# Patient Record
Sex: Male | Born: 2000 | Race: Black or African American | Hispanic: No | Marital: Single | Smoking: Current some day smoker
Health system: Southern US, Community
[De-identification: ages and names within clinical notes are randomized; demographics above are authoritative.]

## PROBLEM LIST (undated history)

## (undated) DIAGNOSIS — J45909 Unspecified asthma, uncomplicated: Secondary | ICD-10-CM

## (undated) DIAGNOSIS — F909 Attention-deficit hyperactivity disorder, unspecified type: Secondary | ICD-10-CM

---
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
## 2018-02-27 IMAGING — CR DG CHEST 2V
2 series · 2 of 2 positions shown · non-contrast
Comparison: 10/08/2007

CLINICAL DATA: Chest pain.  Cough.

EXAM:
CHEST  2 VIEW

[chest pa]
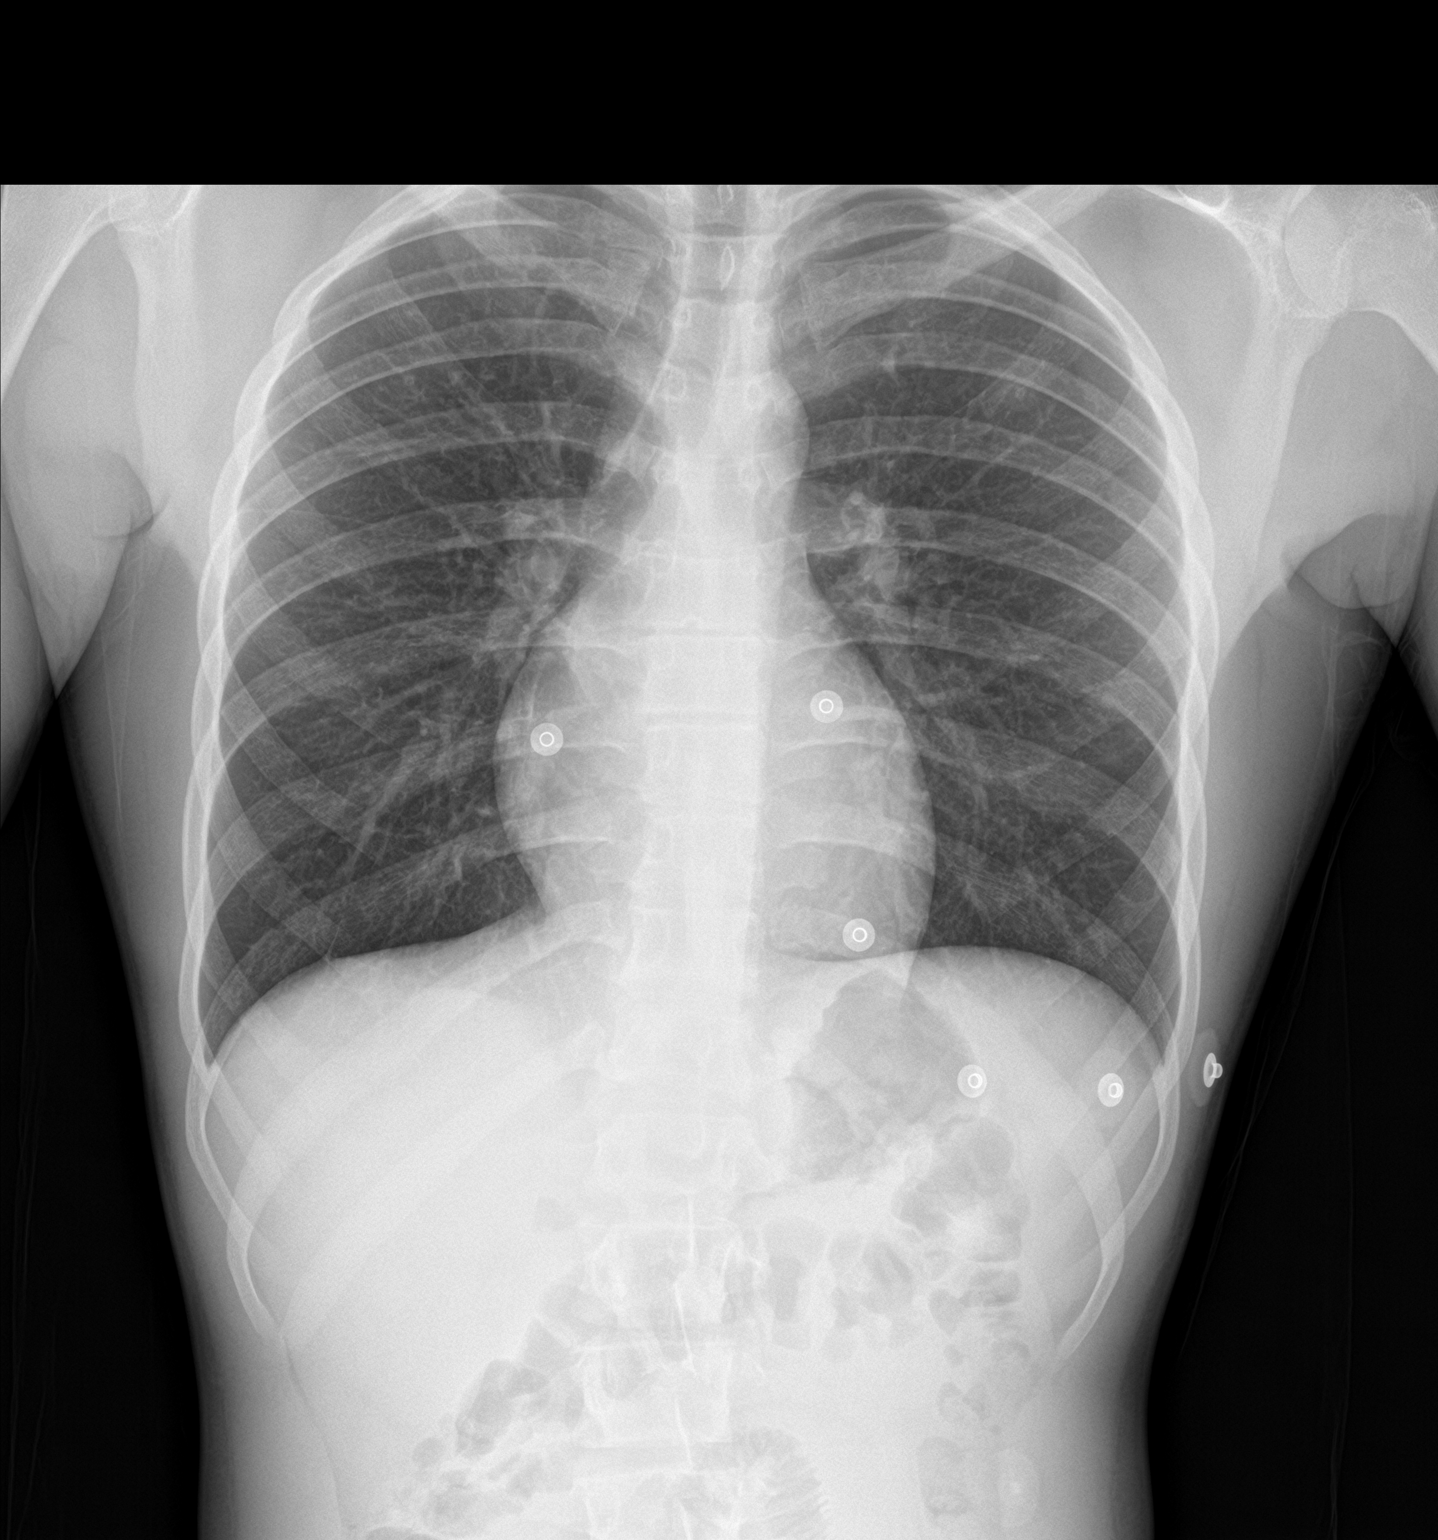

[chest lat]
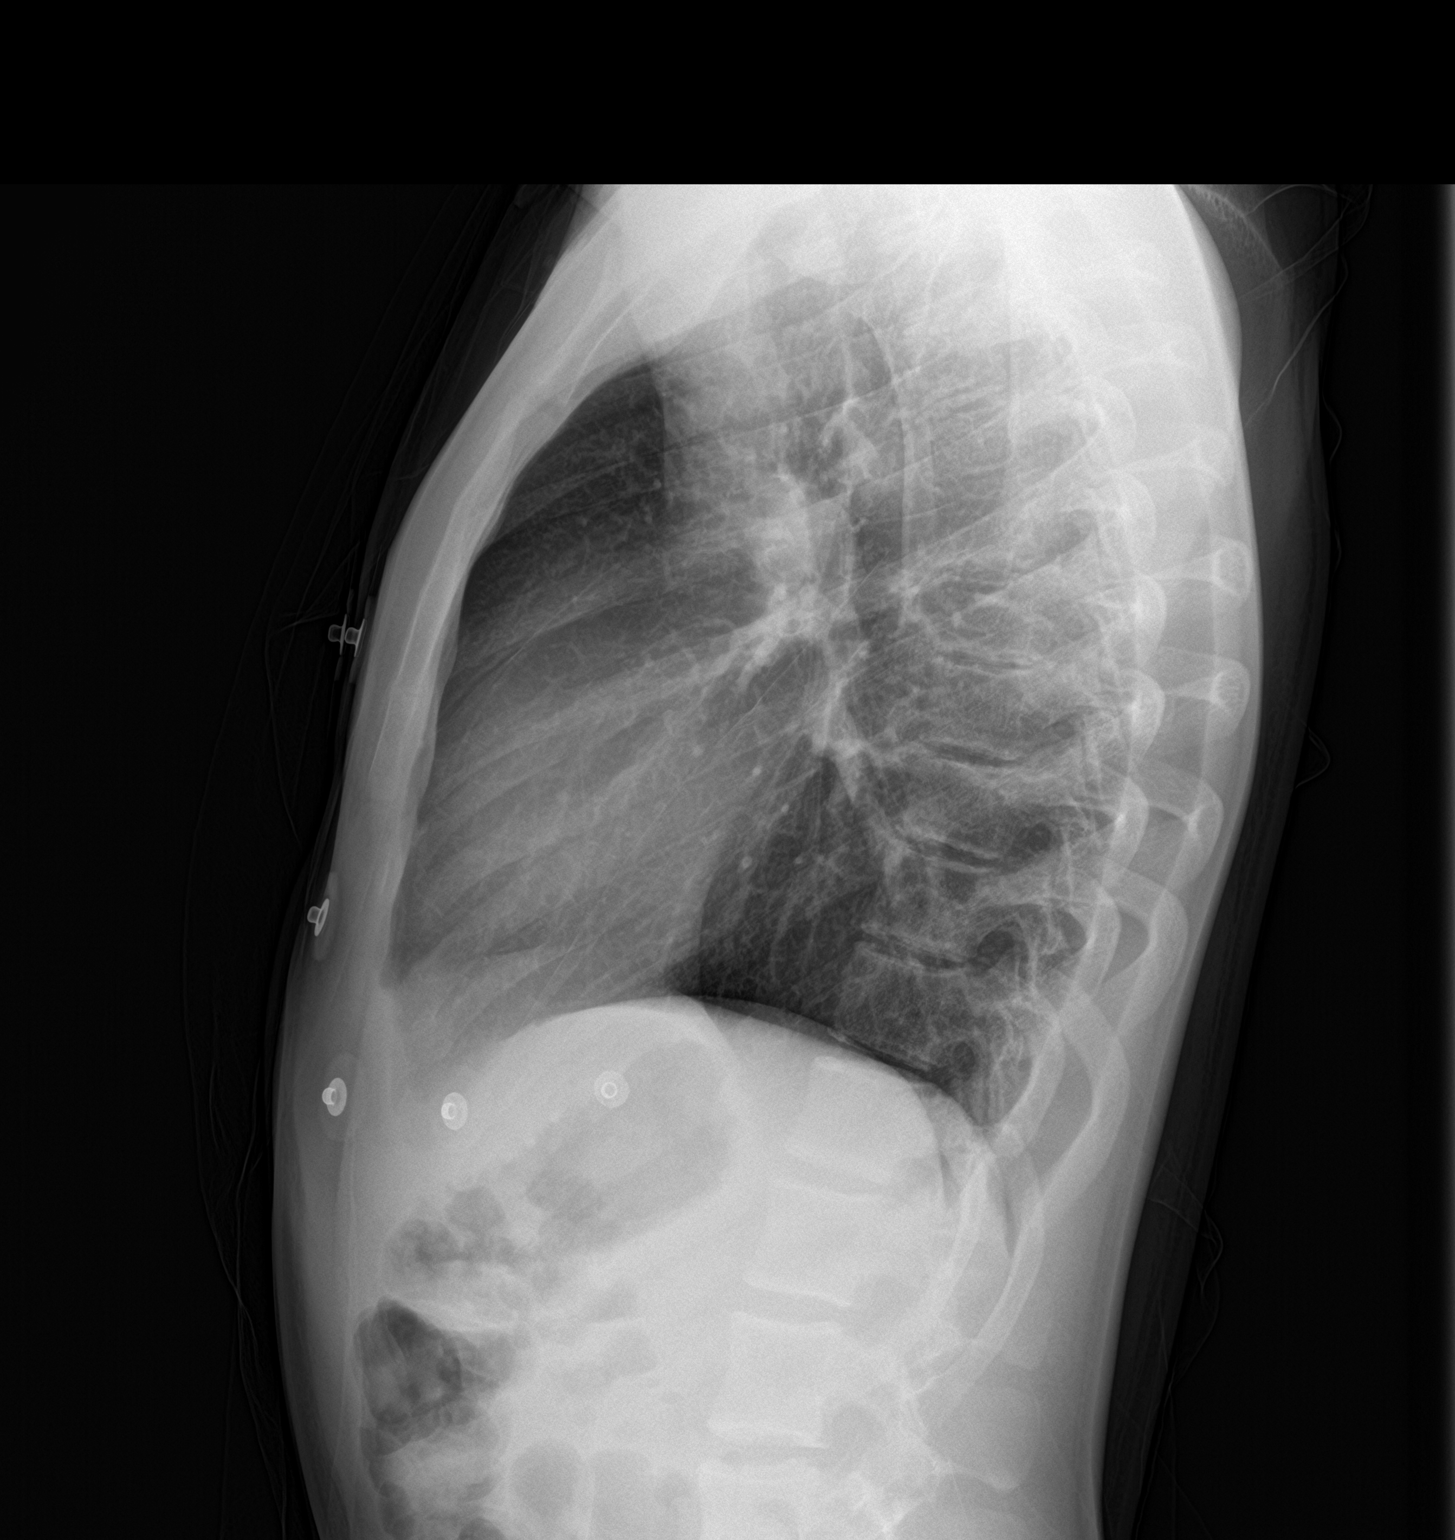

[2 of 2 positions shown; findings below may reference images not displayed]

FINDINGS: The heart size and mediastinal contours are within normal limits.
Both lungs are clear. The visualized skeletal structures are
unremarkable.
IMPRESSION: No active cardiopulmonary disease.

## 2018-08-18 ENCOUNTER — Encounter (HOSPITAL_COMMUNITY): Payer: Self-pay

## 2018-08-18 ENCOUNTER — Emergency Department (HOSPITAL_COMMUNITY)
Admission: EM | Admit: 2018-08-18 | Discharge: 2018-08-18 | Disposition: A | Discharge status: Home / Self Care | Payer: Medicaid Other | Attending: Emergency Medicine | Admitting: Emergency Medicine

## 2018-08-18 DIAGNOSIS — R05 Cough: Secondary | ICD-10-CM | POA: Diagnosis not present

## 2018-08-18 DIAGNOSIS — Z5321 Procedure and treatment not carried out due to patient leaving prior to being seen by health care provider: Secondary | ICD-10-CM | POA: Diagnosis not present

## 2018-08-18 DIAGNOSIS — R509 Fever, unspecified: Secondary | ICD-10-CM | POA: Insufficient documentation

## 2018-08-18 NOTE — ED Notes (Signed)
Did not answer for room X3 

## 2018-08-18 NOTE — ED Triage Notes (Signed)
Pt states that he has been having cough, stuffy, fever, vomiting, headache, chills, and body aches all day

## 2018-10-24 ENCOUNTER — Other Ambulatory Visit: Payer: Self-pay

## 2018-10-24 ENCOUNTER — Encounter (HOSPITAL_COMMUNITY): Payer: Self-pay

## 2018-10-24 ENCOUNTER — Emergency Department (HOSPITAL_COMMUNITY)
Admission: EM | Admit: 2018-10-24 | Discharge: 2018-10-24 | Disposition: A | Discharge status: Home / Self Care | Payer: Medicaid Other | Attending: Emergency Medicine | Admitting: Emergency Medicine

## 2018-10-24 DIAGNOSIS — R1030 Lower abdominal pain, unspecified: Secondary | ICD-10-CM | POA: Insufficient documentation

## 2018-10-24 DIAGNOSIS — J45909 Unspecified asthma, uncomplicated: Secondary | ICD-10-CM | POA: Diagnosis not present

## 2018-10-24 DIAGNOSIS — F909 Attention-deficit hyperactivity disorder, unspecified type: Secondary | ICD-10-CM | POA: Diagnosis not present

## 2018-10-24 DIAGNOSIS — F17200 Nicotine dependence, unspecified, uncomplicated: Secondary | ICD-10-CM | POA: Insufficient documentation

## 2018-10-24 LAB — COMPREHENSIVE METABOLIC PANEL WITH GFR
ALT: 18 U/L (ref 0–44)
AST: 27 U/L (ref 15–41)
Albumin: 3.4 g/dL — ABNORMAL LOW (ref 3.5–5.0)
Alkaline Phosphatase: 43 U/L (ref 38–126)
Anion gap: 6 (ref 5–15)
BUN: 8 mg/dL (ref 6–20)
CO2: 28 mmol/L (ref 22–32)
Calcium: 8.6 mg/dL — ABNORMAL LOW (ref 8.9–10.3)
Chloride: 108 mmol/L (ref 98–111)
Creatinine, Ser: 0.88 mg/dL (ref 0.61–1.24)
GFR calc Af Amer: >60 mL/min
GFR calc non Af Amer: >60 mL/min
Glucose, Bld: 108 mg/dL — ABNORMAL HIGH (ref 70–99)
Potassium: 3.8 mmol/L (ref 3.5–5.1)
Sodium: 142 mmol/L (ref 135–145)
Total Bilirubin: 0.4 mg/dL (ref 0.3–1.2)
Total Protein: 5.8 g/dL — ABNORMAL LOW (ref 6.5–8.1)

## 2018-10-24 LAB — URINALYSIS, ROUTINE W REFLEX MICROSCOPIC
BILIRUBIN URINE: NEGATIVE
GLUCOSE, UA: NEGATIVE mg/dL
Hgb urine dipstick: NEGATIVE
KETONES UR: NEGATIVE mg/dL
LEUKOCYTE UA: NEGATIVE
Nitrite: NEGATIVE
PROTEIN: NEGATIVE mg/dL
Specific Gravity, Urine: 1.016 (ref 1.005–1.030)
pH: 7 (ref 5.0–8.0)

## 2018-10-24 LAB — CBC
HCT: 46.3 % (ref 39.0–52.0)
HEMOGLOBIN: 14.4 g/dL (ref 13.0–17.0)
MCH: 28.9 pg (ref 26.0–34.0)
MCHC: 31.1 g/dL (ref 30.0–36.0)
MCV: 93 fL (ref 80.0–100.0)
PLATELETS: 207 10*3/uL (ref 150–400)
RBC: 4.98 MIL/uL (ref 4.22–5.81)
RDW: 13.2 % (ref 11.5–15.5)
WBC: 4.2 10*3/uL (ref 4.0–10.5)
nRBC: 0 % (ref 0.0–0.2)

## 2018-10-24 LAB — LIPASE, BLOOD: Lipase: 24 U/L (ref 11–51)

## 2018-10-24 MED ORDER — ACETAMINOPHEN 325 MG PO TABS
650.0000 mg | ORAL_TABLET | Freq: Once | ORAL | Status: AC
  Administered 2018-10-24: 650 mg via ORAL
  Filled 2018-10-24: qty 2

## 2018-10-24 MED ORDER — SODIUM CHLORIDE 0.9% FLUSH
3.0000 mL | Freq: Once | INTRAVENOUS | Status: AC
  Administered 2018-10-24: 3 mL via INTRAVENOUS

## 2018-10-24 NOTE — Discharge Instructions (Signed)
Your laboratory results were within normal limits today. I tried obtaining a CT to r/o appendicitis. If you experience any fever, stop eating, or pain worsen please return to the ED. You may alternate ibuprofen and tylenol for your pain.

## 2018-10-24 NOTE — ED Provider Notes (Signed)
[facility]-EMERGENCY DEPT Provider Note   CSN: [id number] Arrival date & time: 10/24/18  1855    History   Chief Complaint Chief Complaint  Patient presents with  . Abdominal Pain    HPI Mario Sanford is a 18 y.o. male.     18 y/o male with a PMH of ADHD, Asthma presents to the ED with a chief complaint of lower abdominal pain x 3 hours. Patient reports a pressure pain around the lower part of his suprapubic region with radiation to both sides. He reports not trying any medication for relieve in symptoms. He reports his last bowel movement was 3 hours ago which was normal in nature. Reports his last meal was this evening before the pain and consisted of egg rolls. He denies any fever, nausea, vomiting, diarrhea,previous surgical history to his abdomen.      Past Medical History:  Diagnosis Date  . ADHD   . Asthma     There are no active problems to display for this patient.   History reviewed. No pertinent surgical history.      Home Medications    Prior to Admission medications   Medication Sig Start Date End Date Taking? Authorizing Provider  albuterol (PROVENTIL HFA;VENTOLIN HFA) 108 (90 BASE) MCG/ACT inhaler Inhale 2 puffs into the lungs 2 (two) times daily as needed for wheezing or shortness of breath.    [provider]  beclomethasone (QVAR) 80 MCG/ACT inhaler Inhale 1 puff into the lungs 2 (two) times daily as needed (for wheezing).    [provider]  dexmethylphenidate (FOCALIN XR) 15 MG 24 hr capsule Take 15 mg by mouth daily. Only on school days    [provider]  GuanFACINE HCl (INTUNIV) 3 MG TB24 Take 3 mg by mouth every evening. On school days only    [provider]    Family History No family history on file.  Social History Social History   Tobacco Use  . Smoking status: Current Some Day Smoker  . Smokeless tobacco: Never Used  Substance Use Topics  . Alcohol use: Yes  . Drug  use: Not on file     Allergies   Penicillins and Iodine   Review of Systems Review of Systems  Constitutional: Negative for chills and fever.  HENT: Negative for ear pain and sore throat.   Eyes: Negative for pain and visual disturbance.  Respiratory: Negative for cough and shortness of breath.   Cardiovascular: Negative for chest pain and palpitations.  Gastrointestinal: Positive for abdominal pain. Negative for blood in stool, constipation, diarrhea, nausea and vomiting.  Genitourinary: Negative for dysuria and hematuria.  Musculoskeletal: Negative for arthralgias and back pain.  Skin: Negative for color change and rash.  Neurological: Negative for seizures and syncope.  All other systems reviewed and are negative.    Physical Exam Updated Vital Signs BP 124/80   Pulse 66   Temp 99 F (37.2 C) (Oral)   Resp 18   Ht  (1.626 m)   Wt 56.7 kg   SpO2 100%   BMI 21.46 kg/m   Physical Exam Vitals signs and nursing note reviewed.  Constitutional:      Appearance: He is well-developed.  HENT:     Head: Normocephalic and atraumatic.  Eyes:     General: No scleral icterus.    Pupils: Pupils are equal, round, and reactive to light.  Neck:     Musculoskeletal: Normal range of motion.  Cardiovascular:  Rate and Rhythm: Normal rate.     Heart sounds: Normal heart sounds.  Pulmonary:     Effort: Pulmonary effort is normal.     Breath sounds: Normal breath sounds. No wheezing.  Chest:     Chest wall: No tenderness.  Abdominal:     General: Bowel sounds are normal. There is no distension.     Palpations: Abdomen is soft.     Tenderness: There is abdominal tenderness in the right lower quadrant, suprapubic area and left lower quadrant. There is guarding. There is no right CVA tenderness or left CVA tenderness. Positive signs include McBurney's sign. Negative signs include Murphy's sign and Rovsing's sign.  Musculoskeletal:        General: No tenderness or  deformity.  Skin:    General: Skin is warm and dry.  Neurological:     Mental Status: He is alert and oriented to person, place, and time.      ED Treatments / Results  Labs (all labs ordered are listed, but only abnormal results are displayed) Labs Reviewed  COMPREHENSIVE METABOLIC PANEL - Abnormal; Notable for the following components:      Result Value   Glucose, Bld 108 (*)    Calcium 8.6 (*)    Total Protein 5.8 (*)    Albumin 3.4 (*)    All other components within normal limits  URINALYSIS, ROUTINE W REFLEX MICROSCOPIC - Abnormal; Notable for the following components:   Color, Urine STRAW (*)    All other components within normal limits  LIPASE, BLOOD  CBC    EKG None  Radiology No results found.  Procedures Procedures (including critical care time)  Medications Ordered in ED Medications  sodium chloride flush (NS) 0.9 % injection 3 mL (has no administration in time range)  acetaminophen (TYLENOL) tablet 650 mg (650 mg Oral Given 10/24/18 2000)     Initial Impression / Assessment and Plan / ED Course  I have reviewed the triage vital signs and the nursing notes.  Pertinent labs & imaging results that were available during my care of the patient were reviewed by me and considered in my medical decision making (see chart for details).       Patient with no previous medical history presents to the ED with lower abdominal pain x3 hours.  He reports not taking any medication for his pain.  Reports the pain is mainly on the periumbilical region radiating to the right and left side.  He reports the pain started after having some egg rolls for dinner which was 3 hours ago.  Denies any fevers, previous surgical history on his abdomen.  Low suspicion for obstruction as patient had bowel movement prior to arrival, denies any diarrhea or blood in his stool.  Pain is along the lower area, will obtain blood work along with provide patient with Tylenol for his pain.  CMP  showed no electrolyte abnormality, he denies any episodes of vomiting, low suspicion for any cholecystitis, LFTs are within normal limits reports pain is more so in the lower area.  Lipase is within normal limits.  CBC showed no leukocytosis, hemoglobin is within normal limits.  9:07 PM patient was reassessed by me, explained about obtaining imaging at this time, he reports his pain is feeling better.  I have discussed the risks and benefit of leaving at this time.  Patient reports he needs to go home at this time and will return if he worsens, experiences fever.  I explained to him that  I cannot rule out appendicitis although he is afebrile, has not white blood cell count at this time.  Patient is advised to return if he experiences any fever, anorexia, worsening symptoms.  Patient and family member at the bedside understand and agree with risks and benefits.  Stable for discharge with stable vital signs discharged from the ED.  Final Clinical Impressions(s) / ED Diagnoses   Final diagnoses:  Lower abdominal pain    ED Discharge Orders    None       Freddy Jaksch 10/24/18 2108    Charlynne Pander, MD 10/24/18 [phone]

## 2018-10-24 NOTE — ED Triage Notes (Addendum)
Pt states LUQ abdominal pain x several hours. Pt denies N/V/D.

## 2019-01-10 ENCOUNTER — Encounter (HOSPITAL_COMMUNITY): Payer: Self-pay

## 2019-01-10 ENCOUNTER — Emergency Department (HOSPITAL_COMMUNITY)
Admission: EM | Admit: 2019-01-10 | Discharge: 2019-01-10 | Discharge status: Against Medical Advice | Payer: Medicaid Other | Attending: Emergency Medicine | Admitting: Emergency Medicine

## 2019-01-10 ENCOUNTER — Other Ambulatory Visit: Payer: Self-pay

## 2019-01-10 DIAGNOSIS — R300 Dysuria: Secondary | ICD-10-CM | POA: Diagnosis present

## 2019-01-10 DIAGNOSIS — Z5321 Procedure and treatment not carried out due to patient leaving prior to being seen by health care provider: Secondary | ICD-10-CM | POA: Insufficient documentation

## 2019-01-10 NOTE — ED Notes (Signed)
PT just walked out of room. LEFT MIA

## 2019-01-10 NOTE — ED Triage Notes (Signed)
Pt reports burning with urination and urinary frequency for the past 4 days. States he would like to be tested for STDs also.

## 2019-02-12 ENCOUNTER — Encounter (HOSPITAL_COMMUNITY): Payer: Self-pay

## 2019-02-12 ENCOUNTER — Ambulatory Visit (HOSPITAL_COMMUNITY)
Admission: EM | Admit: 2019-02-12 | Discharge: 2019-02-12 | Disposition: A | Discharge status: Home / Self Care | Payer: Medicaid Other | Attending: Internal Medicine | Admitting: Internal Medicine

## 2019-02-12 DIAGNOSIS — R300 Dysuria: Secondary | ICD-10-CM | POA: Insufficient documentation

## 2019-02-12 LAB — POCT URINALYSIS DIP (DEVICE)
Bilirubin Urine: NEGATIVE
Glucose, UA: NEGATIVE mg/dL
Hgb urine dipstick: NEGATIVE
Ketones, ur: NEGATIVE mg/dL
Nitrite: NEGATIVE
Protein, ur: NEGATIVE mg/dL
Specific Gravity, Urine: >=1.03 (ref 1.005–1.030)
Urobilinogen, UA: 0.2 mg/dL (ref 0.0–1.0)
pH: 6 (ref 5.0–8.0)

## 2019-02-12 MED ORDER — GUANFACINE HCL ER 3 MG PO TB24: 3.0000 mg | ORAL_TABLET | Freq: Every evening | ORAL | 0 refills | Status: AC

## 2019-02-12 MED ORDER — ALBUTEROL SULFATE HFA 108 (90 BASE) MCG/ACT IN AERS: 2.0000 | INHALATION_SPRAY | Freq: Two times a day (BID) | RESPIRATORY_TRACT | 0 refills | Status: AC | PRN

## 2019-02-12 MED ORDER — BECLOMETHASONE DIPROPIONATE 80 MCG/ACT IN AERS: 1.0000 | INHALATION_SPRAY | Freq: Two times a day (BID) | RESPIRATORY_TRACT | 0 refills | Status: AC | PRN

## 2019-02-12 MED ORDER — DEXMETHYLPHENIDATE HCL ER 15 MG PO CP24: 15.0000 mg | ORAL_CAPSULE | Freq: Every day | ORAL | 0 refills | Status: AC

## 2019-02-12 NOTE — ED Provider Notes (Signed)
[facility]    CSN: [id number] Arrival date & time: 02/12/19  1351     History   Chief Complaint Chief Complaint  Patient presents with  . Exposure to STD  . Urinary Frequency    HPI Mario Sanford is a 18 y.o. male a history of ADHD-controlled on Focalin, asthma-controlled with albuterol comes to urgent care with complaints of  penile discomfort when he is urinating.  Symptoms have been ongoing for a month.  He has not tried any over-the-counter medications.  He is sexually active and has engaged in unprotected sexual intercourse.  No testicular pain or swelling.  No penile ulceration.  No rash.  No fever or chills.  No nausea vomiting.  Patient is also requesting refill on his routine medications.  HPI  Past Medical History:  Diagnosis Date  . ADHD   . Asthma     There are no active problems to display for this patient.   History reviewed. No pertinent surgical history.     Home Medications    Prior to Admission medications   Medication Sig Start Date End Date Taking? Authorizing Provider  albuterol (VENTOLIN HFA) 108 (90 Base) MCG/ACT inhaler Inhale 2 puffs into the lungs 2 (two) times daily as needed for wheezing or shortness of breath. 02/12/19   Merrilee JanskyLamptey, Keigan Girten O, MD  beclomethasone (QVAR) 80 MCG/ACT inhaler Inhale 1 puff into the lungs 2 (two) times daily as needed (for wheezing). 02/12/19   Merrilee JanskyLamptey, Dajha Urquilla O, MD  dexmethylphenidate (FOCALIN XR) 15 MG 24 hr capsule Take 1 capsule (15 mg total) by mouth daily. Only on school days 02/12/19   Gaylene Moylan, Britta MccreedyPhilip O, MD  GuanFACINE HCl (INTUNIV) 3 MG TB24 Take 1 tablet (3 mg total) by mouth every evening. On school days only 02/12/19   Kaymarie Wynn, Britta MccreedyPhilip O, MD    Family History History reviewed. No pertinent family history.  Social History Social History   Tobacco Use  . Smoking status: Current Some Day Smoker  . Smokeless tobacco: Never Used  Substance Use Topics  . Alcohol use: Yes  . Drug use: Not on  file     Allergies   Penicillins and Iodine   Review of Systems Review of Systems  Constitutional: Negative.   HENT: Negative for mouth sores.   Eyes: Negative for discharge and itching.  Respiratory: Negative.   Cardiovascular: Negative.   Gastrointestinal: Negative for constipation, nausea and vomiting.  Genitourinary: Positive for dysuria. Negative for genital sores, penile pain, penile swelling and testicular pain.  Musculoskeletal: Negative for arthralgias, gait problem, joint swelling and myalgias.  Skin: Negative for rash and wound.  Neurological: Negative.      Physical Exam Triage Vital Signs ED Triage Vitals  Enc Vitals Group     BP 02/12/19 1429 109/70     Pulse Rate 02/12/19 1429 93     Resp 02/12/19 1429 18     Temp 02/12/19 1429 98.2 F (36.8 C)     Temp Source 02/12/19 1429 Oral     SpO2 02/12/19 1429 99 %     Weight --      Height --      Head Circumference --      Peak Flow --      Pain Score 02/12/19 1430 7     Pain Loc --      Pain Edu? --      Excl. in GC? --    No data found.  Updated Vital Signs BP 109/70 (  BP Location: Left Arm)   Pulse 93   Temp 98.2 F (36.8 C) (Oral)   Resp 18   SpO2 99%   Visual Acuity Right Eye Distance:   Left Eye Distance:   Bilateral Distance:    Right Eye Near:   Left Eye Near:    Bilateral Near:     Physical Exam Constitutional:      Appearance: Normal appearance. He is not ill-appearing or toxic-appearing.  HENT:     Mouth/Throat:     Mouth: Mucous membranes are moist.     Pharynx: No oropharyngeal exudate or posterior oropharyngeal erythema.  Cardiovascular:     Rate and Rhythm: Normal rate and regular rhythm.     Pulses: Normal pulses.     Heart sounds: Normal heart sounds. No murmur. No gallop.   Pulmonary:     Effort: Pulmonary effort is normal. No respiratory distress.     Breath sounds: Normal breath sounds. No wheezing or rales.  Abdominal:     General: Bowel sounds are normal.      Palpations: Abdomen is soft.  Musculoskeletal: Normal range of motion.  Skin:    General: Skin is warm.     Capillary Refill: Capillary refill takes less than 2 seconds.  Neurological:     General: No focal deficit present.     Mental Status: He is alert and oriented to person, place, and time.      UC Treatments / Results  Labs (all labs ordered are listed, but only abnormal results are displayed) Labs Reviewed  POCT URINALYSIS DIP (DEVICE) - Abnormal; Notable for the following components:      Result Value   Leukocytes,Ua TRACE (*)    All other components within normal limits  URINE CYTOLOGY ANCILLARY ONLY - Abnormal; Notable for the following components:   Chlamydia **POSITIVE** (*)    All other components within normal limits    EKG   Radiology No results found.  Procedures Procedures (including critical care time)  Medications Ordered in UC Medications - No data to display  Initial Impression / Assessment and Plan / UC Course  I have reviewed the triage vital signs and the nursing notes.  Pertinent labs & imaging results that were available during my care of the patient were reviewed by me and considered in my medical decision making (see chart for details).     1.  Possible STD exposure: Urine for GC chlamydia Safe sex counseling given Patient will be informed if testing is significant  2.  ADHD: Refill Focalin  3.  Mild intermittent asthma: Refill albuterol inhaler. Final Clinical Impressions(s) / UC Diagnoses   Final diagnoses:  Burning with urination   Discharge Instructions   None    ED Prescriptions    Medication Sig Dispense Auth. Provider   albuterol (VENTOLIN HFA) 108 (90 Base) MCG/ACT inhaler Inhale 2 puffs into the lungs 2 (two) times daily as needed for wheezing or shortness of breath. 6.7 g Chase Picket, MD   beclomethasone (QVAR) 80 MCG/ACT inhaler Inhale 1 puff into the lungs 2 (two) times daily as needed (for wheezing). 1  Inhaler Jalecia Leon, Myrene Galas, MD   dexmethylphenidate (FOCALIN XR) 15 MG 24 hr capsule Take 1 capsule (15 mg total) by mouth daily. Only on school days 30 capsule Alfretta Pinch, Myrene Galas, MD   GuanFACINE HCl (INTUNIV) 3 MG TB24 Take 1 tablet (3 mg total) by mouth every evening. On school days only 30 tablet Daimon Kean, Myrene Galas, MD  Controlled Substance Prescriptions [place] Controlled Substance Registry consulted? No   Merrilee JanskyLamptey, Tag Wurtz O, MD 02/16/19 1051

## 2019-02-12 NOTE — ED Triage Notes (Signed)
Pt C/O frequency urinating with some pinching sensation as he urinating.  Symptoms been going on for over a month per pt

## 2019-02-15 LAB — URINE CYTOLOGY ANCILLARY ONLY
Chlamydia: POSITIVE — AB
Neisseria Gonorrhea: NEGATIVE
Trichomonas: NEGATIVE

## 2019-02-17 ENCOUNTER — Telehealth (HOSPITAL_COMMUNITY): Payer: Self-pay | Admitting: Emergency Medicine

## 2019-02-17 MED ORDER — AZITHROMYCIN 250 MG PO TABS: 1000.0000 mg | ORAL_TABLET | Freq: Once | ORAL | 0 refills | Status: AC

## 2019-02-17 NOTE — Telephone Encounter (Signed)
Chlamydia is positive.  Rx po zithromax 1g #1 dose no refills was sent to the pharmacy of record.  Pt needs education to please refrain from sexual intercourse for 7 days to give the medicine time to work, sexual partners need to be notified and tested/treated.  Condoms may reduce risk of reinfection.  Recheck or followup with PCP for further evaluation if symptoms are not improving.   [facility] notified.  Patient contacted and made aware of all results, all questions answered.    

## 2019-04-08 ENCOUNTER — Emergency Department (HOSPITAL_COMMUNITY)
Admission: EM | Admit: 2019-04-08 | Discharge: 2019-04-08 | Disposition: A | Discharge status: Home / Self Care | Payer: Medicaid Other | Attending: Emergency Medicine | Admitting: Emergency Medicine

## 2019-04-08 ENCOUNTER — Other Ambulatory Visit: Payer: Self-pay

## 2019-04-08 ENCOUNTER — Encounter (HOSPITAL_COMMUNITY): Payer: Self-pay | Admitting: Emergency Medicine

## 2019-04-08 DIAGNOSIS — Z79899 Other long term (current) drug therapy: Secondary | ICD-10-CM | POA: Diagnosis not present

## 2019-04-08 DIAGNOSIS — Z202 Contact with and (suspected) exposure to infections with a predominantly sexual mode of transmission: Secondary | ICD-10-CM | POA: Insufficient documentation

## 2019-04-08 DIAGNOSIS — N5089 Other specified disorders of the male genital organs: Secondary | ICD-10-CM | POA: Insufficient documentation

## 2019-04-08 DIAGNOSIS — F17210 Nicotine dependence, cigarettes, uncomplicated: Secondary | ICD-10-CM | POA: Diagnosis not present

## 2019-04-08 DIAGNOSIS — J45909 Unspecified asthma, uncomplicated: Secondary | ICD-10-CM | POA: Insufficient documentation

## 2019-04-08 DIAGNOSIS — Z711 Person with feared health complaint in whom no diagnosis is made: Secondary | ICD-10-CM

## 2019-04-08 LAB — URINALYSIS, ROUTINE W REFLEX MICROSCOPIC
Bilirubin Urine: NEGATIVE
Glucose, UA: NEGATIVE mg/dL
Hgb urine dipstick: NEGATIVE
Ketones, ur: 20 mg/dL — AB
Leukocytes,Ua: NEGATIVE
Nitrite: NEGATIVE
Protein, ur: NEGATIVE mg/dL
Specific Gravity, Urine: 1.027 (ref 1.005–1.030)
pH: 6 (ref 5.0–8.0)

## 2019-04-08 MED ORDER — AZITHROMYCIN 250 MG PO TABS
1000.0000 mg | ORAL_TABLET | Freq: Once | ORAL | Status: AC
  Administered 2019-04-08: 14:00:00 1000 mg via ORAL
  Filled 2019-04-08: qty 4

## 2019-04-08 MED ORDER — LIDOCAINE HCL (PF) 1 % IJ SOLN
INTRAMUSCULAR | Status: AC
  Administered 2019-04-08: 5 mL
  Filled 2019-04-08: qty 5

## 2019-04-08 MED ORDER — CEFTRIAXONE SODIUM 250 MG IJ SOLR
250.0000 mg | Freq: Once | INTRAMUSCULAR | Status: AC
  Administered 2019-04-08: 250 mg via INTRAMUSCULAR
  Filled 2019-04-08: qty 250

## 2019-04-08 NOTE — ED Triage Notes (Signed)
Pt requesting to be tested for all sexually transmitted diseases.

## 2019-04-08 NOTE — ED Provider Notes (Signed)
[facility] EMERGENCY DEPARTMENT Provider Note   CSN: [id number] Arrival date & time: 04/08/19  1131     History   Chief Complaint Chief Complaint  Patient presents with  . Exposure to STD    HPI Mario Sanford is a 18 y.o. male presents today for concern of sexual transmitted disease.  Patient reports that he has sex often without a condom he is not having any symptoms but would like to be tested and treated today.  Patient reports that he was treated for STDs in July however has had intercourse without a condom since that time.  Patient denies fever/chills, nausea/vomiting, abdominal pain, rash, testicular pain/swelling, penile discharge, dysuria/hematuria, rectal pain or any additional can symptoms today.     HPI  Past Medical History:  Diagnosis Date  . ADHD   . Asthma     There are no active problems to display for this patient.   History reviewed. No pertinent surgical history.      Home Medications    Prior to Admission medications   Medication Sig Start Date End Date Taking? Authorizing Provider  albuterol (VENTOLIN HFA) 108 (90 Base) MCG/ACT inhaler Inhale 2 puffs into the lungs 2 (two) times daily as needed for wheezing or shortness of breath. 02/12/19   Chase Picket, MD  beclomethasone (QVAR) 80 MCG/ACT inhaler Inhale 1 puff into the lungs 2 (two) times daily as needed (for wheezing). 02/12/19   Chase Picket, MD  dexmethylphenidate (FOCALIN XR) 15 MG 24 hr capsule Take 1 capsule (15 mg total) by mouth daily. Only on school days 02/12/19   Lamptey, Myrene Galas, MD  GuanFACINE HCl (INTUNIV) 3 MG TB24 Take 1 tablet (3 mg total) by mouth every evening. On school days only 02/12/19   Lamptey, Myrene Galas, MD    Family History History reviewed. No pertinent family history.  Social History Social History   Tobacco Use  . Smoking status: Current Some Day Smoker    Packs/day: 1.00    Types: Cigars, Cigarettes  . Smokeless tobacco: Never  Used  Substance Use Topics  . Alcohol use: Yes  . Drug use: Yes    Types: Marijuana     Allergies   Penicillins and Iodine   Review of Systems Review of Systems Ten systems are reviewed and are negative for acute change except as noted in the HPI  Physical Exam Updated Vital Signs BP 117/76 (BP Location: Left Arm)   Pulse (!) 57   Temp 98.6 F (37 C) (Oral)   Resp 16   Ht 5\' 6"  (1.676 m)   Wt 58.5 kg   SpO2 100%   BMI 20.82 kg/m   Physical Exam Constitutional:      General: He is not in acute distress.    Appearance: Normal appearance. He is well-developed. He is not ill-appearing or diaphoretic.  HENT:     Head: Normocephalic and atraumatic.     Right Ear: External ear normal.     Left Ear: External ear normal.     Nose: Nose normal.  Eyes:     General: Vision grossly intact. Gaze aligned appropriately.     Pupils: Pupils are equal, round, and reactive to light.  Neck:     Musculoskeletal: Normal range of motion.     Trachea: Trachea and phonation normal. No tracheal deviation.  Pulmonary:     Effort: Pulmonary effort is normal. No respiratory distress.  Abdominal:     General: There is no  distension.     Palpations: Abdomen is soft.     Tenderness: There is no abdominal tenderness. There is no guarding or rebound.  Genitourinary:    Comments: Chaperone present during genital exam Brianna NT.  No external genital lesions noted, no bumps on head of penis, specifically no vesicles concerning for herpes or chancre suggestive of syphilis.  No pain with palpation of the penis/glans, no discharge or urethritis noted.  Scrotum and testicles without erythema/swelling or tenderness to palpation. Cremasteric reflex intact bilaterally. No palpable hernia noted.  Musculoskeletal: Normal range of motion.  Skin:    General: Skin is warm and dry.  Neurological:     Mental Status: He is alert.     GCS: GCS eye subscore is 4. GCS verbal subscore is 5. GCS motor subscore  is 6.     Comments: Speech is clear and goal oriented, follows commands Major Cranial nerves without deficit, no facial droop Moves extremities without ataxia, coordination intact  Psychiatric:        Behavior: Behavior normal.     ED Treatments / Results  Labs (all labs ordered are listed, but only abnormal results are displayed) Labs Reviewed  URINALYSIS, ROUTINE W REFLEX MICROSCOPIC - Abnormal; Notable for the following components:      Result Value   Ketones, ur 20 (*)    All other components within normal limits  RPR  HIV ANTIBODY (ROUTINE TESTING W REFLEX)  GC/CHLAMYDIA PROBE AMP ([place]) NOT [facility]    EKG None  Radiology No results found.  Procedures Procedures (including critical care time)  Medications Ordered in ED Medications  cefTRIAXone (ROCEPHIN) injection 250 mg (250 mg Intramuscular Given 04/08/19 1331)  azithromycin (ZITHROMAX) tablet 1,000 mg (1,000 mg Oral Given 04/08/19 1330)  lidocaine (PF) (XYLOCAINE) 1 % injection (5 mLs  Given 04/08/19 1332)     Initial Impression / Assessment and Plan / ED Course  I have reviewed the triage vital signs and the nursing notes.  Pertinent labs & imaging results that were available during my care of the patient were reviewed by me and considered in my medical decision making (see chart for details).     18 year old male presented today for STD check without symptoms.  Patient tested for GC/chlamydia, HIV, syphilis today, treated with azithromycin and Rocephin. Patient advised to inform and treat all sexual partners.  Pt advised on safe sex practices and understands that they have GC/Chlamydia cultures pending and will result in 2-3 days. HIV and RPR sent. Patient encouraged to follow up at local health department for future STI checks. No concern for prostatitis or epididymitis at this time. Discussed return precautions. Pt appears safe for discharge.   Of note patient with listed rash to penicillins, after  receiving Rocephin he was monitored for 1 hour without evidence of allergic reaction.  Urinalysis with 20 ketones, patient encouraged to drink more water.  Negative for trichomonas.  No indication for Flagyl at this time.  At this time there does not appear to be any evidence of an acute emergency medical condition and the patient appears stable for discharge with appropriate outpatient follow up. Diagnosis was discussed with patient who verbalizes understanding of care plan and is agreeable to discharge. I have discussed return precautions with patient who verbalizes understanding of return precautions. Patient encouraged to follow-up with their PCP. All questions answered.  Note: Portions of this report may have been transcribed using voice recognition software. Every effort was made to ensure accuracy; however,  inadvertent computerized transcription errors may still be present. Final Clinical Impressions(s) / ED Diagnoses   Final diagnoses:  Concern about STD in male without diagnosis    ED Discharge Orders    None       Elizabeth Palau 04/08/19 1642    Charlynne Pander, MD 04/10/19 [phone]

## 2019-04-08 NOTE — Discharge Instructions (Signed)
You have been diagnosed today with concern for STD.  At this time there does not appear to be the presence of an emergent medical condition, however there is always the potential for conditions to change. Please read and follow the below instructions.  Please return to the Emergency Department immediately for any new or worsening symptoms. Please be sure to follow up with your Primary Care Provider within one week regarding your visit today; please call their office to schedule an appointment even if you are feeling better for a follow-up visit. You have been treated presumptively today for gonorrhea and chlamydia.  You have been tested today for gonorrhea and chlamydia as well as HIV and syphilis. These results will be available in approximately 3 days. You may check your MyChart account for results. Please inform all sexual partners of positive results and that they should be tested and treated as well. Please wait 2 weeks and be sure that you and your partners are symptom free before returning to sexual activity. Please use protection with every sexual encounter. Follow Up: Please followup with your primary doctor in 3 days for discussion of your diagnoses and further evaluation after today's visit; if you do not have a primary care doctor use the resource guide provided to find one; Please return to the ER for worsening symptoms, high fevers or persistent vomiting. Please wear a condom with every sexual encounter.  Get help right away if: Your pain gets worse and does not get better with medicine. You have abnormal discharge. You develop flu-like symptoms, such as night sweats, sore throat, or muscle aches. You have any new/concerning or worsening symptoms  Please read the additional information packets attached to your discharge summary.  Do not take your medicine if  develop an itchy rash, swelling in your mouth or lips, or difficulty breathing; call 911 and seek immediate emergency medical  attention if this occurs.

## 2019-04-09 LAB — GC/CHLAMYDIA PROBE AMP (~~LOC~~) NOT AT ARMC
Chlamydia: NEGATIVE
Neisseria Gonorrhea: NEGATIVE

## 2019-04-09 LAB — RPR: RPR Ser Ql: NONREACTIVE

## 2019-04-09 LAB — HIV ANTIBODY (ROUTINE TESTING W REFLEX): HIV Screen 4th Generation wRfx: NONREACTIVE

## 2020-07-29 ENCOUNTER — Encounter (HOSPITAL_COMMUNITY): Payer: Self-pay | Admitting: Emergency Medicine

## 2020-07-29 ENCOUNTER — Other Ambulatory Visit: Payer: Self-pay

## 2020-07-29 ENCOUNTER — Emergency Department (HOSPITAL_COMMUNITY)
Admission: EM | Admit: 2020-07-29 | Discharge: 2020-07-29 | Disposition: A | Discharge status: Home / Self Care | Payer: Medicaid Other | Attending: Emergency Medicine | Admitting: Emergency Medicine

## 2020-07-29 DIAGNOSIS — Z79899 Other long term (current) drug therapy: Secondary | ICD-10-CM | POA: Diagnosis not present

## 2020-07-29 DIAGNOSIS — F17210 Nicotine dependence, cigarettes, uncomplicated: Secondary | ICD-10-CM | POA: Diagnosis not present

## 2020-07-29 DIAGNOSIS — R2241 Localized swelling, mass and lump, right lower limb: Secondary | ICD-10-CM | POA: Diagnosis present

## 2020-07-29 DIAGNOSIS — J45909 Unspecified asthma, uncomplicated: Secondary | ICD-10-CM | POA: Insufficient documentation

## 2020-07-29 DIAGNOSIS — L02415 Cutaneous abscess of right lower limb: Secondary | ICD-10-CM | POA: Diagnosis not present

## 2020-07-29 DIAGNOSIS — L0291 Cutaneous abscess, unspecified: Secondary | ICD-10-CM

## 2020-07-29 MED ORDER — NAPROXEN 375 MG PO TABS: 375.0000 mg | ORAL_TABLET | Freq: Two times a day (BID) | ORAL | 0 refills | Status: AC

## 2020-07-29 MED ORDER — DOXYCYCLINE HYCLATE 100 MG PO CAPS: 100.0000 mg | ORAL_CAPSULE | Freq: Two times a day (BID) | ORAL | 0 refills | Status: AC

## 2020-07-29 NOTE — ED Provider Notes (Signed)
[facility]-EMERGENCY DEPT Provider Note   CSN: [id number] Arrival date & time: 07/29/20  1604     History Chief Complaint  Patient presents with  . Insect Bite    Mario Sanford is a 19 y.o. male.  Who presents emergency department chief complaint of painful swelling of the right lower extremity.  Is been ongoing for 2 days.  He states that it has worsened.  It is painful and his leg is aching.  Is never had anything like this before.  Denies fevers or chills.  HPI     Past Medical History:  Diagnosis Date  . ADHD   . Asthma     There are no problems to display for this patient.   History reviewed. No pertinent surgical history.     No family history on file.  Social History   Tobacco Use  . Smoking status: Current Some Day Smoker    Packs/day: 1.00    Types: Cigars, Cigarettes  . Smokeless tobacco: Never Used  Vaping Use  . Vaping Use: Never used  Substance Use Topics  . Alcohol use: Yes  . Drug use: Yes    Types: Marijuana    Home Medications Prior to Admission medications   Medication Sig Start Date End Date Taking? Authorizing Provider  albuterol (VENTOLIN HFA) 108 (90 Base) MCG/ACT inhaler Inhale 2 puffs into the lungs 2 (two) times daily as needed for wheezing or shortness of breath. 02/12/19   Merrilee Jansky, MD  beclomethasone (QVAR) 80 MCG/ACT inhaler Inhale 1 puff into the lungs 2 (two) times daily as needed (for wheezing). 02/12/19   Merrilee Jansky, MD  dexmethylphenidate (FOCALIN XR) 15 MG 24 hr capsule Take 1 capsule (15 mg total) by mouth daily. Only on school days 02/12/19   Lamptey, Britta Mccreedy, MD  doxycycline (VIBRAMYCIN) 100 MG capsule Take 1 capsule (100 mg total) by mouth 2 (two) times daily. One po bid x 7 days 07/29/20   Arthor Captain, PA-C  GuanFACINE HCl (INTUNIV) 3 MG TB24 Take 1 tablet (3 mg total) by mouth every evening. On school days only 02/12/19   Lamptey, Britta Mccreedy, MD  naproxen (NAPROSYN) 375 MG tablet  Take 1 tablet (375 mg total) by mouth 2 (two) times daily with a meal. 07/29/20   Arthor Captain, PA-C    Allergies    Penicillins and Iodine  Review of Systems   Review of Systems Ten systems reviewed and are negative for acute change, except as noted in the HPI.   Physical Exam Updated Vital Signs BP 111/64 (BP Location: Right Arm)   Pulse 81   Temp 98.5 F (36.9 C) (Oral)   Resp 18   SpO2 99%   Physical Exam Vitals and nursing note reviewed.  Constitutional:      General: He is not in acute distress.    Appearance: He is well-developed and well-nourished. He is not diaphoretic.  HENT:     Head: Normocephalic and atraumatic.  Eyes:     General: No scleral icterus.    Conjunctiva/sclera: Conjunctivae normal.  Cardiovascular:     Rate and Rhythm: Normal rate and regular rhythm.     Heart sounds: Normal heart sounds.  Pulmonary:     Effort: Pulmonary effort is normal. No respiratory distress.     Breath sounds: Normal breath sounds.  Abdominal:     Palpations: Abdomen is soft.     Tenderness: There is no abdominal tenderness.  Musculoskeletal:  General: No edema.     Cervical back: Normal range of motion and neck supple.     Comments: 2 cm area of erythema with tenderness to palpation Minimal purulent drainage with pressure.  Compartments of the calf are tender.  Skin:    General: Skin is warm and dry.  Neurological:     Mental Status: He is alert.  Psychiatric:        Behavior: Behavior normal.     ED Results / Procedures / Treatments   Labs (all labs ordered are listed, but only abnormal results are displayed) Labs Reviewed - No data to display  EKG None  Radiology No results found.  Procedures Procedures (including critical care time)  Medications Ordered in ED Medications - No data to display  ED Course  I have reviewed the triage vital signs and the nursing notes.  Pertinent labs & imaging results that were available during my care of  the patient were reviewed by me and considered in my medical decision making (see chart for details).    MDM Rules/Calculators/A&P                          Patient with small abscess of the right lower extremity.  This does not need I&D.  Encouraged home warm water soaks and will discharge with doxycycline and naproxen.  Discussed return precautions.  He was appropriate for discharge at this time Final Clinical Impression(s) / ED Diagnoses Final diagnoses:  Abscess    Rx / DC Orders ED Discharge Orders         Ordered    doxycycline (VIBRAMYCIN) 100 MG capsule  2 times daily        07/29/20 1654    naproxen (NAPROSYN) 375 MG tablet  2 times daily with meals        07/29/20 1654           Arthor Captain, PA-C 07/29/20 1713    Alvira Monday, MD 07/30/20 1224

## 2020-07-29 NOTE — ED Triage Notes (Signed)
Patient c/o potential insect bite to right lower leg x2 days.

## 2020-07-29 NOTE — Discharge Instructions (Signed)
Contact a doctor if: You have more redness, swelling, or pain around your abscess. You have more fluid or blood coming from your abscess. Your abscess feels warm when you touch it. You have more pus or a bad smell coming from your abscess. You have a fever. Your muscles ache. You have chills. You feel sick. Get help right away if: You have very bad (severe) pain. You see red streaks on your skin spreading away from the abscess.

## 2020-10-10 ENCOUNTER — Other Ambulatory Visit: Payer: Self-pay | Admitting: Internal Medicine

## 2020-10-11 LAB — COMPLETE METABOLIC PANEL WITH GFR
AG Ratio: 1.9 (calc) (ref 1.0–2.5)
ALT: 20 U/L (ref 9–46)
AST: 29 U/L (ref 10–40)
Albumin: 4.7 g/dL (ref 3.6–5.1)
Alkaline phosphatase (APISO): 61 U/L (ref 36–130)
BUN: 9 mg/dL (ref 7–25)
CO2: 26 mmol/L (ref 20–32)
Calcium: 9.7 mg/dL (ref 8.6–10.3)
Chloride: 106 mmol/L (ref 98–110)
Creat: 0.92 mg/dL (ref 0.60–1.35)
GFR, Est African American: 137 mL/min/{1.73_m2} (ref >=60)
GFR, Est Non African American: 118 mL/min/{1.73_m2} (ref >=60)
Globulin: 2.5 g/dL (calc) (ref 1.9–3.7)
Glucose, Bld: 88 mg/dL (ref 65–99)
Potassium: 3.8 mmol/L (ref 3.5–5.3)
Sodium: 142 mmol/L (ref 135–146)
Total Bilirubin: 0.6 mg/dL (ref 0.2–1.2)
Total Protein: 7.2 g/dL (ref 6.1–8.1)

## 2020-10-11 LAB — CBC
HCT: 44 % (ref 38.5–50.0)
Hemoglobin: 15.3 g/dL (ref 13.2–17.1)
MCH: 30.7 pg (ref 27.0–33.0)
MCHC: 34.8 g/dL (ref 32.0–36.0)
MCV: 88.2 fL (ref 80.0–100.0)
MPV: 10.9 fL (ref 7.5–12.5)
Platelets: 246 10*3/uL (ref 140–400)
RBC: 4.99 10*6/uL (ref 4.20–5.80)
RDW: 13.4 % (ref 11.0–15.0)
WBC: 3.9 10*3/uL (ref 3.8–10.8)

## 2020-10-11 LAB — LIPID PANEL
Cholesterol: 190 mg/dL (ref <200)
HDL: 50 mg/dL (ref >=40)
LDL Cholesterol (Calc): 125 mg/dL (calc) — ABNORMAL HIGH
Non-HDL Cholesterol (Calc): 140 mg/dL (calc) — ABNORMAL HIGH (ref <130)
Total CHOL/HDL Ratio: 3.8 (calc) (ref <5.0)
Triglycerides: 56 mg/dL (ref <150)

## 2020-10-11 LAB — VITAMIN D 25 HYDROXY (VIT D DEFICIENCY, FRACTURES): Vit D, 25-Hydroxy: 12 ng/mL — ABNORMAL LOW (ref 30–100)

## 2020-10-11 LAB — TSH: TSH: 0.69 mIU/L (ref 0.40–4.50)
